# Patient Record
Sex: Female | Born: 1995 | Race: White | Hispanic: No | Marital: Single | Smoking: Never smoker
Health system: Southern US, Community
[De-identification: ages and names within clinical notes are randomized; demographics above are authoritative.]

## PROBLEM LIST (undated history)

## (undated) DIAGNOSIS — S83249A Other tear of medial meniscus, current injury, unspecified knee, initial encounter: Secondary | ICD-10-CM

## (undated) DIAGNOSIS — S83519A Sprain of anterior cruciate ligament of unspecified knee, initial encounter: Secondary | ICD-10-CM

## (undated) HISTORY — PX: ANTERIOR CRUCIATE LIGAMENT REPAIR: SHX115

---
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
## 2016-02-26 ENCOUNTER — Ambulatory Visit (INDEPENDENT_AMBULATORY_CARE_PROVIDER_SITE_OTHER): Payer: Self-pay | Admitting: Orthopedic Surgery

## 2016-02-26 DIAGNOSIS — M25561 Pain in right knee: Secondary | ICD-10-CM

## 2016-02-27 ENCOUNTER — Other Ambulatory Visit (INDEPENDENT_AMBULATORY_CARE_PROVIDER_SITE_OTHER): Payer: Self-pay | Admitting: Orthopedic Surgery

## 2016-02-27 DIAGNOSIS — M25562 Pain in left knee: Secondary | ICD-10-CM

## 2016-02-27 NOTE — Progress Notes (Addendum)
   Post-Op Visit Note   Patient: Annette Black           Date of Birth: 1995-08-21           MRN: [id number] Visit Date: 02/26/2016 PCP: Carollee HerterLALONDE,JOHN CHARLES, MD I transposed the note on Annette Black and Annette Black which is the reason for this atypical note.  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  She had left knee anterior cruciate ligament reconstruction bone patella tendon bone autograft in [place].  She had an injury earlier this year which was more of a strain.  MRI scan was done at that time which showed strain of the anterior cruciate ligament but generally intact structural fibers.  2 days ago she was doing box jumps at home as part of a training regimen and she felt and heard a pop in her left knee.  She's had difficulty weightbearing since that time.  On exam she lacks about 5 of full extension she has flexion to about 90 but is having some pain with that.  She distally has more anterior laxity with positive Lachman's on the left than she did several months ago.  Collaterals are stable PCL is intact there is no posterior lateral rotatory instability pedal pulses intact on the left plan at this time is for MRI scan to evaluate for anterior cruciate ligament tear.  She has a large effusion.  I like to have that evaluated in clinic later this week.  If it's and inhibiting her flexion and we will proceed with aspiration MRI scan is pending  Follow-Up Instructions: Return if symptoms worsen or fail to improve.   Orders:  No orders of the defined types were placed in this encounter.  No orders of the defined types were placed in this encounter.    PMFS History: There are no active problems to display for this patient.  No past medical  history on file.  No family history on file.  No past surgical history on file. Social History   Occupational History  . Not on file.   Social History Main Topics  . Smoking status: Not on file  . Smokeless tobacco: Not on file  . Alcohol use Not on file  . Drug use: Unknown  . Sexual activity: Not on file

## 2016-02-28 ENCOUNTER — Encounter (INDEPENDENT_AMBULATORY_CARE_PROVIDER_SITE_OTHER): Payer: Self-pay | Admitting: Orthopedic Surgery

## 2016-02-28 ENCOUNTER — Ambulatory Visit (INDEPENDENT_AMBULATORY_CARE_PROVIDER_SITE_OTHER): Payer: Managed Care, Other (non HMO) | Admitting: Orthopedic Surgery

## 2016-02-28 DIAGNOSIS — M25562 Pain in left knee: Secondary | ICD-10-CM

## 2016-02-28 DIAGNOSIS — S83512A Sprain of anterior cruciate ligament of left knee, initial encounter: Secondary | ICD-10-CM

## 2016-02-28 MED ORDER — LIDOCAINE HCL 1 % IJ SOLN
5.0000 mL | INTRAMUSCULAR | Status: AC | PRN
  Administered 2016-02-28: 5 mL

## 2016-02-28 NOTE — Progress Notes (Signed)
Office Visit Note   Patient: Annette Black           Date of Birth: 1995/11/15           MRN: [id number] Visit Date: 02/28/2016 Requested by: Ronnald NianJohn C Lalonde, MD [address] [place], [place] [id number] PCP: Carollee HerterLALONDE,JOHN CHARLES, MD  Subjective: Chief Complaint  Patient presents with  . 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  She is doing some training last week at home and did a box jump and had a pop in her knee.  She's had previous anterior cruciate ligament reconstruction on the left-hand side in 2015 bone patella tendon bone autograft.  Presence or absence of meniscal damage at that time unknown.  She initially was having difficulty weightbearing and she has been on crutches.              Review of Systems All systems reviewed are negative as they relate to the chief complaint within the history of present illness.  Patient denies  fevers or chills.    Assessment & Plan: Visit Diagnoses:  1. Acute pain of left knee   2. Complete tear of anterior cruciate ligament of left knee, initial encounter     Plan: Impression is left knee loadbearing injury with effusion and laxity on exam.  40 mL of serosanguineous fluid aspirated from the left knee today.  This is done for pain control and to help with of rehabilitation.  MRI scan pending.  High likelihood that she has complete graft rupture based on her exam and presence of bloody effusion in the knee.  Follow-Up Instructions: Return for after MRI.   Orders:  No orders of the defined types were placed in this encounter.  No orders of the defined types were placed in this encounter.     Procedures: Large Joint Inj Date/Time: 02/28/2016 10:02 AM Performed by: Cammy CopaEAN, SCOTT GREGORY Authorized by: Cammy CopaEAN, SCOTT GREGORY   Consent Given by:  Patient Site marked: the procedure site was marked   Timeout: prior to procedure the correct patient, procedure, and site was verified     Indications:  Pain, joint swelling and diagnostic evaluation Location:  Knee Site:  L knee Prep: patient was prepped and draped in usual sterile fashion   Needle Size:  18 G Needle Length:  1.5 inches Approach:  Superolateral Ultrasound Guidance: No   Fluoroscopic Guidance: No   Arthrogram: No   Medications:  5 mL lidocaine 1 % Aspiration Attempted: Yes   Aspirate amount (mL):  40 Aspirate:  Blood-tinged Patient tolerance:  Patient tolerated the procedure well with no immediate complications     Clinical Data: No additional findings.  Objective: Vital Signs: There were no vitals taken for this visit.  Physical Exam   Constitutional: Patient appears well-developed HEENT:  Head: Normocephalic Eyes:EOM are normal Neck: Normal range of motion Cardiovascular: Normal rate Pulmonary/chest: Effort normal Neurologic: Patient is alert Skin: Skin is warm Psychiatric: Patient has normal mood and affect    Ortho Exam examination of the left knee demonstrates effusion anterior cruciate ligament laxity with posterior lateral rotatory instability collateral patient stable pedal pulses intact no groin pain interlocks rotation leg no other masses lymph adenopathy or skin changes noted in the left knee region since her mechanism is intact  Specialty Comments:  No specialty comments available.  Imaging: No results found.   PMFS History: There are no active problems to display for this patient.  No past medical history on file.  No family history on file.  No past surgical history on file. Social History   Occupational History  . Not on file.   Social History Main Topics  . Smoking status: Never Smoker  . Smokeless tobacco: Never Used  . Alcohol use Not on file  . Drug use: Unknown  . Sexual activity: Not on file

## 2016-03-01 ENCOUNTER — Ambulatory Visit
Admission: RE | Admit: 2016-03-01 | Discharge: 2016-03-01 | Disposition: A | Discharge status: Home / Self Care | Payer: Managed Care, Other (non HMO) | Source: Ambulatory Visit | Attending: Orthopedic Surgery | Admitting: Orthopedic Surgery

## 2016-03-01 DIAGNOSIS — M25562 Pain in left knee: Secondary | ICD-10-CM

## 2016-03-04 ENCOUNTER — Other Ambulatory Visit (INDEPENDENT_AMBULATORY_CARE_PROVIDER_SITE_OTHER): Payer: Self-pay | Admitting: Orthopedic Surgery

## 2016-03-04 ENCOUNTER — Encounter (HOSPITAL_COMMUNITY): Payer: Self-pay | Admitting: *Deleted

## 2016-03-04 DIAGNOSIS — S83242A Other tear of medial meniscus, current injury, left knee, initial encounter: Secondary | ICD-10-CM

## 2016-03-04 NOTE — Progress Notes (Signed)
Pt denies SOB, chest pain, and being under the care of a cardiologist. Pt denies having a stress test, echo and cardiac cath. Pt denies having a chest x ray and EKG within the last year. Pt denies having recent labs. Pt made aware to stop taking taking Aspirin, vitamins, fish oil and herbal medications. Do not take any NSAIDs ie: Ibuprofen, Advil, Naproxen, BC and Goody Powder or any medication containing Aspirin. Pt verbalized understanding of all pre-op instructions.

## 2016-03-05 ENCOUNTER — Ambulatory Visit (HOSPITAL_COMMUNITY): Payer: Managed Care, Other (non HMO) | Admitting: Anesthesiology

## 2016-03-05 ENCOUNTER — Encounter (HOSPITAL_COMMUNITY): Payer: Self-pay | Admitting: Certified Registered Nurse Anesthetist

## 2016-03-05 ENCOUNTER — Encounter (HOSPITAL_COMMUNITY)
Admission: RE | Disposition: A | Discharge status: Home / Self Care | Payer: Self-pay | Source: Ambulatory Visit | Attending: Orthopedic Surgery

## 2016-03-05 ENCOUNTER — Ambulatory Visit (HOSPITAL_COMMUNITY)
Admission: RE | Admit: 2016-03-05 | Discharge: 2016-03-05 | Disposition: A | Discharge status: Home / Self Care | Payer: Managed Care, Other (non HMO) | Source: Ambulatory Visit | Attending: Orthopedic Surgery | Admitting: Orthopedic Surgery

## 2016-03-05 DIAGNOSIS — S83242A Other tear of medial meniscus, current injury, left knee, initial encounter: Secondary | ICD-10-CM

## 2016-03-05 DIAGNOSIS — Y92009 Unspecified place in unspecified non-institutional (private) residence as the place of occurrence of the external cause: Secondary | ICD-10-CM | POA: Insufficient documentation

## 2016-03-05 DIAGNOSIS — Z91018 Allergy to other foods: Secondary | ICD-10-CM | POA: Diagnosis not present

## 2016-03-05 DIAGNOSIS — X58XXXA Exposure to other specified factors, initial encounter: Secondary | ICD-10-CM | POA: Diagnosis not present

## 2016-03-05 DIAGNOSIS — S83212A Bucket-handle tear of medial meniscus, current injury, left knee, initial encounter: Secondary | ICD-10-CM | POA: Insufficient documentation

## 2016-03-05 DIAGNOSIS — Y998 Other external cause status: Secondary | ICD-10-CM | POA: Insufficient documentation

## 2016-03-05 DIAGNOSIS — Y93B9 Activity, other involving muscle strengthening exercises: Secondary | ICD-10-CM | POA: Insufficient documentation

## 2016-03-05 HISTORY — PX: KNEE ARTHROSCOPY WITH MENISCAL REPAIR: SHX5653

## 2016-03-05 HISTORY — DX: Other tear of medial meniscus, current injury, unspecified knee, initial encounter: S83.249A

## 2016-03-05 HISTORY — DX: Sprain of anterior cruciate ligament of unspecified knee, initial encounter: S83.519A

## 2016-03-05 LAB — CBC
HEMATOCRIT: 39.2 % (ref 36.0–46.0)
Hemoglobin: 13.1 g/dL (ref 12.0–15.0)
MCH: 30.2 pg (ref 26.0–34.0)
MCHC: 33.4 g/dL (ref 30.0–36.0)
MCV: 90.3 fL (ref 78.0–100.0)
PLATELETS: 180 10*3/uL (ref 150–400)
RBC: 4.34 MIL/uL (ref 3.87–5.11)
RDW: 12.7 % (ref 11.5–15.5)
WBC: 7.3 10*3/uL (ref 4.0–10.5)

## 2016-03-05 LAB — HCG, SERUM, QUALITATIVE: Preg, Serum: NEGATIVE

## 2016-03-05 SURGERY — ARTHROSCOPY, KNEE, WITH MENISCUS REPAIR
Anesthesia: General | Site: Knee | Laterality: Left

## 2016-03-05 MED ORDER — LIDOCAINE 2% (20 MG/ML) 5 ML SYRINGE
INTRAMUSCULAR | Status: AC
  Filled 2016-03-05: qty 10

## 2016-03-05 MED ORDER — BUPIVACAINE-EPINEPHRINE (PF) 0.5% -1:200000 IJ SOLN
INTRAMUSCULAR | Status: DC | PRN
  Administered 2016-03-05: 20 mL

## 2016-03-05 MED ORDER — BUPIVACAINE-EPINEPHRINE (PF) 0.5% -1:200000 IJ SOLN
INTRAMUSCULAR | Status: DC | PRN
  Administered 2016-03-05: 20 mL via PERINEURAL

## 2016-03-05 MED ORDER — SUGAMMADEX SODIUM 200 MG/2ML IV SOLN
INTRAVENOUS | Status: AC
  Filled 2016-03-05: qty 2

## 2016-03-05 MED ORDER — PROPOFOL 10 MG/ML IV BOLUS
INTRAVENOUS | Status: AC
  Filled 2016-03-05: qty 20

## 2016-03-05 MED ORDER — FENTANYL CITRATE (PF) 100 MCG/2ML IJ SOLN
INTRAMUSCULAR | Status: AC
  Filled 2016-03-05: qty 2

## 2016-03-05 MED ORDER — MIDAZOLAM HCL 2 MG/2ML IJ SOLN
INTRAMUSCULAR | Status: AC
  Filled 2016-03-05: qty 2

## 2016-03-05 MED ORDER — SUCCINYLCHOLINE CHLORIDE 200 MG/10ML IV SOSY
PREFILLED_SYRINGE | INTRAVENOUS | Status: AC
  Filled 2016-03-05: qty 10

## 2016-03-05 MED ORDER — LIDOCAINE 2% (20 MG/ML) 5 ML SYRINGE
INTRAMUSCULAR | Status: DC | PRN
  Administered 2016-03-05: 20 mg via INTRAVENOUS

## 2016-03-05 MED ORDER — CLONIDINE HCL (ANALGESIA) 100 MCG/ML EP SOLN
150.0000 ug | Freq: Once | EPIDURAL | Status: DC
  Filled 2016-03-05: qty 1.5

## 2016-03-05 MED ORDER — LACTATED RINGERS IV SOLN
INTRAVENOUS | Status: DC
  Administered 2016-03-05 (×2): via INTRAVENOUS

## 2016-03-05 MED ORDER — KETOROLAC TROMETHAMINE 30 MG/ML IJ SOLN: 30.0000 mg | Freq: Once | INTRAMUSCULAR | Status: DC | PRN

## 2016-03-05 MED ORDER — EPHEDRINE 5 MG/ML INJ
INTRAVENOUS | Status: AC
  Filled 2016-03-05: qty 20

## 2016-03-05 MED ORDER — CLONIDINE HCL (ANALGESIA) 100 MCG/ML EP SOLN
EPIDURAL | Status: DC | PRN
  Administered 2016-03-05: .75 mL

## 2016-03-05 MED ORDER — MIDAZOLAM HCL 5 MG/5ML IJ SOLN
INTRAMUSCULAR | Status: DC | PRN
  Administered 2016-03-05: 2 mg via INTRAVENOUS

## 2016-03-05 MED ORDER — CEFAZOLIN SODIUM-DEXTROSE 2-4 GM/100ML-% IV SOLN
2.0000 g | INTRAVENOUS | Status: AC
  Administered 2016-03-05: 2 g via INTRAVENOUS

## 2016-03-05 MED ORDER — ASPIRIN EC 325 MG PO TBEC: 325.0000 mg | DELAYED_RELEASE_TABLET | Freq: Every day | ORAL | 0 refills | Status: AC

## 2016-03-05 MED ORDER — PROMETHAZINE HCL 25 MG/ML IJ SOLN: 6.2500 mg | INTRAMUSCULAR | Status: DC | PRN

## 2016-03-05 MED ORDER — ONDANSETRON HCL 4 MG/2ML IJ SOLN
INTRAMUSCULAR | Status: DC | PRN
  Administered 2016-03-05: 4 mg via INTRAVENOUS

## 2016-03-05 MED ORDER — DEXAMETHASONE SODIUM PHOSPHATE 10 MG/ML IJ SOLN
INTRAMUSCULAR | Status: AC
  Filled 2016-03-05: qty 1

## 2016-03-05 MED ORDER — PROPOFOL 10 MG/ML IV BOLUS
INTRAVENOUS | Status: DC | PRN
  Administered 2016-03-05: 50 mg via INTRAVENOUS
  Administered 2016-03-05: 150 mg via INTRAVENOUS

## 2016-03-05 MED ORDER — DEXAMETHASONE SODIUM PHOSPHATE 10 MG/ML IJ SOLN
INTRAMUSCULAR | Status: DC | PRN
  Administered 2016-03-05: 5 mg via INTRAVENOUS

## 2016-03-05 MED ORDER — SODIUM CHLORIDE 0.9 % IR SOLN
Status: DC | PRN
  Administered 2016-03-05 (×2): 3000 mL

## 2016-03-05 MED ORDER — MORPHINE SULFATE (PF) 4 MG/ML IV SOLN
INTRAVENOUS | Status: AC
  Filled 2016-03-05: qty 2

## 2016-03-05 MED ORDER — HYDROMORPHONE HCL 1 MG/ML IJ SOLN: 0.2500 mg | INTRAMUSCULAR | Status: DC | PRN

## 2016-03-05 MED ORDER — ONDANSETRON HCL 4 MG/2ML IJ SOLN
INTRAMUSCULAR | Status: AC
  Filled 2016-03-05: qty 2

## 2016-03-05 MED ORDER — STERILE WATER FOR IRRIGATION IR SOLN
Status: DC | PRN
  Administered 2016-03-05: 1000 mL

## 2016-03-05 MED ORDER — PHENYLEPHRINE 40 MCG/ML (10ML) SYRINGE FOR IV PUSH (FOR BLOOD PRESSURE SUPPORT)
PREFILLED_SYRINGE | INTRAVENOUS | Status: AC
  Filled 2016-03-05: qty 10

## 2016-03-05 MED ORDER — MORPHINE SULFATE (PF) 4 MG/ML IV SOLN
INTRAVENOUS | Status: DC | PRN
  Administered 2016-03-05 (×2): 4 mg

## 2016-03-05 MED ORDER — CHLORHEXIDINE GLUCONATE 4 % EX LIQD: 60.0000 mL | Freq: Once | CUTANEOUS | Status: DC

## 2016-03-05 MED ORDER — ROCURONIUM BROMIDE 50 MG/5ML IV SOSY
PREFILLED_SYRINGE | INTRAVENOUS | Status: AC
  Filled 2016-03-05: qty 10

## 2016-03-05 MED ORDER — OXYCODONE HCL 5 MG PO TABS: 5.0000 mg | ORAL_TABLET | ORAL | 0 refills | Status: AC | PRN

## 2016-03-05 MED ORDER — FENTANYL CITRATE (PF) 100 MCG/2ML IJ SOLN
INTRAMUSCULAR | Status: AC
  Filled 2016-03-05: qty 4

## 2016-03-05 MED ORDER — EPINEPHRINE PF 1 MG/ML IJ SOLN
INTRAMUSCULAR | Status: DC | PRN
  Administered 2016-03-05 (×4)

## 2016-03-05 MED ORDER — FENTANYL CITRATE (PF) 100 MCG/2ML IJ SOLN
INTRAMUSCULAR | Status: DC | PRN
  Administered 2016-03-05: 25 ug via INTRAVENOUS
  Administered 2016-03-05 (×3): 50 ug via INTRAVENOUS
  Administered 2016-03-05: 25 ug via INTRAVENOUS

## 2016-03-05 MED ORDER — 0.9 % SODIUM CHLORIDE (POUR BTL) OPTIME
TOPICAL | Status: DC | PRN
  Administered 2016-03-05: 1000 mL

## 2016-03-05 MED ORDER — BUPIVACAINE-EPINEPHRINE (PF) 0.5% -1:200000 IJ SOLN
INTRAMUSCULAR | Status: AC
  Filled 2016-03-05: qty 30

## 2016-03-05 MED ORDER — EPINEPHRINE PF 1 MG/ML IJ SOLN
INTRAMUSCULAR | Status: AC
  Filled 2016-03-05: qty 4

## 2016-03-05 SURGICAL SUPPLY — 80 items
ALCOHOL 70% 16 OZ (MISCELLANEOUS) ×3 IMPLANT
BANDAGE ACE 6X5 VEL STRL LF (GAUZE/BANDAGES/DRESSINGS) ×3 IMPLANT
BANDAGE ESMARK 6X9 LF (GAUZE/BANDAGES/DRESSINGS) ×1 IMPLANT
BLADE CUTTER GATOR 3.5 (BLADE) IMPLANT
BLADE GREAT WHITE 4.2 (BLADE) ×2 IMPLANT
BLADE GREAT WHITE 4.2MM (BLADE) ×1
BLADE SURG 10 STRL SS (BLADE) ×3 IMPLANT
BLADE SURG 15 STRL LF DISP TIS (BLADE) ×1 IMPLANT
BLADE SURG 15 STRL SS (BLADE) ×2
BLADE SURG ROTATE 9660 (MISCELLANEOUS) IMPLANT
BNDG ESMARK 6X9 LF (GAUZE/BANDAGES/DRESSINGS) ×3
BUR OVAL 6.0 (BURR) IMPLANT
CLOSURE STERI-STRIP 1/2X4 (GAUZE/BANDAGES/DRESSINGS) ×1
CLSR STERI-STRIP ANTIMIC 1/2X4 (GAUZE/BANDAGES/DRESSINGS) ×2 IMPLANT
COVER MAYO STAND STRL (DRAPES) ×3 IMPLANT
COVER SURGICAL LIGHT HANDLE (MISCELLANEOUS) ×3 IMPLANT
CUFF TOURNIQUET SINGLE 34IN LL (TOURNIQUET CUFF) ×3 IMPLANT
CUFF TOURNIQUET SINGLE 44IN (TOURNIQUET CUFF) IMPLANT
DRAPE ARTHROSCOPY W/POUCH 114 (DRAPES) ×3 IMPLANT
DRAPE INCISE IOBAN 66X45 STRL (DRAPES) ×6 IMPLANT
DRAPE ORTHO SPLIT 77X108 STRL (DRAPES) ×6
DRAPE PROXIMA HALF (DRAPES) IMPLANT
DRAPE SURG ORHT 6 SPLT 77X108 (DRAPES) ×3 IMPLANT
DRAPE U-SHAPE 47X51 STRL (DRAPES) ×3 IMPLANT
DRSG TEGADERM 4X4.75 (GAUZE/BANDAGES/DRESSINGS) ×3 IMPLANT
DURAPREP 26ML APPLICATOR (WOUND CARE) ×6 IMPLANT
ELECT REM PT RETURN 9FT ADLT (ELECTROSURGICAL) ×3
ELECTRODE REM PT RTRN 9FT ADLT (ELECTROSURGICAL) ×1 IMPLANT
FIBERLOOP 2 0 (SUTURE) ×33 IMPLANT
FILTER STRAW FLUID ASPIR (MISCELLANEOUS) ×6 IMPLANT
FLUID NSS /IRRIG 3000 ML XXX (IV SOLUTION) ×12 IMPLANT
GAUZE SPONGE 4X4 12PLY STRL (GAUZE/BANDAGES/DRESSINGS) ×3 IMPLANT
GAUZE XEROFORM 1X8 LF (GAUZE/BANDAGES/DRESSINGS) ×3 IMPLANT
GLOVE BIOGEL PI IND STRL 7.5 (GLOVE) ×1 IMPLANT
GLOVE BIOGEL PI IND STRL 8 (GLOVE) ×1 IMPLANT
GLOVE BIOGEL PI INDICATOR 7.5 (GLOVE) ×2
GLOVE BIOGEL PI INDICATOR 8 (GLOVE) ×2
GLOVE ECLIPSE 7.0 STRL STRAW (GLOVE) ×3 IMPLANT
GLOVE SURG ORTHO 8.0 STRL STRW (GLOVE) ×3 IMPLANT
GOWN STRL REUS W/ TWL LRG LVL3 (GOWN DISPOSABLE) ×2 IMPLANT
GOWN STRL REUS W/ TWL XL LVL3 (GOWN DISPOSABLE) ×1 IMPLANT
GOWN STRL REUS W/TWL LRG LVL3 (GOWN DISPOSABLE) ×4
GOWN STRL REUS W/TWL XL LVL3 (GOWN DISPOSABLE) ×2
IMMOBILIZER KNEE 20 (SOFTGOODS) ×3
IMMOBILIZER KNEE 20 THIGH 36 (SOFTGOODS) ×1 IMPLANT
KIT BASIN OR (CUSTOM PROCEDURE TRAY) ×3 IMPLANT
KIT ROOM TURNOVER OR (KITS) ×3 IMPLANT
MANIFOLD NEPTUNE II (INSTRUMENTS) IMPLANT
NEEDLE 18GX1X1/2 (RX/OR ONLY) (NEEDLE) ×6 IMPLANT
NEEDLE HYPO 25GX1X1/2 BEV (NEEDLE) ×3 IMPLANT
NEEDLE SUT 2-0 SCORPION KNEE (NEEDLE) ×3 IMPLANT
NS IRRIG 1000ML POUR BTL (IV SOLUTION) ×3 IMPLANT
PACK ARTHROSCOPY DSU (CUSTOM PROCEDURE TRAY) ×3 IMPLANT
PAD ARMBOARD 7.5X6 YLW CONV (MISCELLANEOUS) ×6 IMPLANT
PADDING CAST COTTON 6X4 STRL (CAST SUPPLIES) ×3 IMPLANT
PENCIL BUTTON HOLSTER BLD 10FT (ELECTRODE) ×3 IMPLANT
SET ARTHROSCOPY TUBING (MISCELLANEOUS) ×4
SET ARTHROSCOPY TUBING LN (MISCELLANEOUS) ×2 IMPLANT
SOL PREP POV-IOD 4OZ 10% (MISCELLANEOUS) ×3 IMPLANT
SPONGE GAUZE 4X4 12PLY STER LF (GAUZE/BANDAGES/DRESSINGS) ×3 IMPLANT
SPONGE LAP 4X18 X RAY DECT (DISPOSABLE) ×3 IMPLANT
SUT ETHILON 3 0 PS 1 (SUTURE) ×3 IMPLANT
SUT FIBERWIRE 2-0 18 17.9 3/8 (SUTURE)
SUT MENISCAL KIT (KITS) ×6 IMPLANT
SUT MNCRL AB 3-0 PS2 18 (SUTURE) ×3 IMPLANT
SUT VIC AB 2-0 CT1 27 (SUTURE) ×2
SUT VIC AB 2-0 CT1 TAPERPNT 27 (SUTURE) ×1 IMPLANT
SUT VIC AB 3-0 CT1 27 (SUTURE) ×2
SUT VIC AB 3-0 CT1 TAPERPNT 27 (SUTURE) ×1 IMPLANT
SUTURE FIBERWR 2-0 18 17.9 3/8 (SUTURE) IMPLANT
SYR 20ML ECCENTRIC (SYRINGE) ×3 IMPLANT
SYR CONTROL 10ML LL (SYRINGE) IMPLANT
SYR TB 1ML LUER SLIP (SYRINGE) ×9 IMPLANT
TOWEL OR 17X24 6PK STRL BLUE (TOWEL DISPOSABLE) ×3 IMPLANT
TOWEL OR 17X26 10 PK STRL BLUE (TOWEL DISPOSABLE) ×3 IMPLANT
TUBE CONNECTING 12'X1/4 (SUCTIONS) ×1
TUBE CONNECTING 12X1/4 (SUCTIONS) ×2 IMPLANT
WAND HAND CNTRL MULTIVAC 90 (MISCELLANEOUS) IMPLANT
WAND STAR VAC 90 (SURGICAL WAND) ×3 IMPLANT
WATER STERILE IRR 1000ML POUR (IV SOLUTION) ×3 IMPLANT

## 2016-03-05 NOTE — Anesthesia Postprocedure Evaluation (Signed)
Anesthesia Post Note  Patient: Armed forces technical officerCienna Black  Procedure(s) Performed: Procedure(s) (LRB): LEFT KNEE ARTHROSCOPY WITH MENISCAL REPAIR (Left)  Patient location during evaluation: PACU Anesthesia Type: General and Regional Level of consciousness: awake and alert Pain management: pain level controlled Vital Signs Assessment: post-procedure vital signs reviewed and stable Respiratory status: spontaneous breathing, nonlabored ventilation and respiratory function stable Cardiovascular status: blood pressure returned to baseline and stable Postop Assessment: no signs of nausea or vomiting Anesthetic complications: no       Last Vitals:  Vitals:   03/05/16 1530 03/05/16 1542  BP: 124/72   Pulse: 91 81  Resp: 16 16  Temp:  36.1 C    Last Pain:  Vitals:   03/05/16 0941  TempSrc:   PainSc: 2             L Sensory Level: S1-Sole of foot, small toes (03/05/16 1530) R Sensory Level: S1-Sole of foot, small toes (03/05/16 1530)  Tymber Stallings,W. EDMOND

## 2016-03-05 NOTE — H&P (Signed)
Aslin Farinas is an 21 y.o. female.   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  She injured her left knee several weeks ago during winter break when she was training at home.  She had previous anterior cruciate ligament reconstruction in 2015 in [place].  This is bone patella tendon bone autograft.  Previous MRI scan demonstrated some strain of the ligament re-months ago after an injury but it was intact.  Since that time she has had another MRI scan which shows bucket-handle tear of the medial meniscus.  There is no family history of DVT or pulmonary embolism.  Past Medical History:  Diagnosis Date  . ACL (anterior cruciate ligament) tear   . Acute medial meniscal tear    left    Past Surgical History:  Procedure Laterality Date  . ANTERIOR CRUCIATE LIGAMENT REPAIR     left    History reviewed. No pertinent family history. Social History:  reports that she has never smoked. She has never used smokeless tobacco. She reports that she does not drink alcohol or use drugs.  Allergies:  Allergies  Allergen Reactions  . Food Other (See Comments)    Bananas-mouth irritation cucumbers, honey dew melon, cantaloupe, & watermelon- sore throat    Medications Prior to Admission  Medication Sig Dispense Refill  . ibuprofen (ADVIL,MOTRIN) 200 MG tablet Take 200-400 mg by mouth every 8 (eight) hours as needed (for pain.).      Results for orders placed or performed during the hospital encounter of 03/05/16 (from the past 48 hour(s))  CBC     Status: None   Collection Time: 03/05/16  9:07 AM  Result Value Ref Range   WBC 7.3 4.0 - 10.5 K/uL   RBC 4.34 3.87 - 5.11 MIL/uL   Hemoglobin 13.1 12.0 - 15.0 g/dL   HCT 40.9 81.1 - 91.4 %   MCV 90.3 78.0 - 100.0 fL   MCH 30.2 26.0 - 34.0 pg   MCHC 33.4 30.0 - 36.0 g/dL   RDW 78.2 95.6 - 21.3 %   Platelets 180 150 - 400 K/uL   No results found.  Review of Systems  Musculoskeletal:  Positive for joint pain.  All other systems reviewed and are negative.   Blood pressure 108/66, pulse 67, temperature 98 F (36.7 C), temperature source Oral, resp. rate 18, last menstrual period 03/04/2016, SpO2 100 %. Physical Exam  Constitutional: She appears well-developed.  HENT:  Head: Normocephalic.  Eyes: Pupils are equal, round, and reactive to light.  Neck: Normal range of motion.  Cardiovascular: Normal rate.   Respiratory: Effort normal.  Neurological: She is alert.  Skin: Skin is warm.  Psychiatric: She has a normal mood and affect.   Examination the left knee demonstrates lack of full extension by 5 effusion is present extensor mechanism is intact pedal pulses palpable well-healed surgical incision from the patellar tendon harvest site is intact.  Patient has no posterior lateral rotatory instability.  Has about 3 mm anterior drawer on the left compared to about 1-2 on the right.  Does feel like there is an endpoint.  But it is a little bit of a difficult exam due to some pain she's having from her meniscal tear.  Assessment/Plan Impression is bucket-handle tear medial meniscus in the left knee.  On MRI scan there was no real bone bruising pattern indicating recurrent injury to the anterior cruciate ligament.  On exam the anterior cruciate ligament may be  a little bit stretched but functionally it does appear to have an endpoint.  It does look intact on the MRI scan.  The repairability of the meniscal tear is to be assessed at the time of arthroscopy.  I'll like to repair this if possible.  Risks and benefits of repair discussed including not limited to infection or vessel damage failure of the repair.  Patient understands the risks and benefits of surgical intervention also discussed the planned surgery with her mother.  All questions answered.  Burnard BuntingG Scott Dorean Daniello, MD 03/05/2016, 10:03 AM

## 2016-03-05 NOTE — Anesthesia Procedure Notes (Signed)
Procedure Name: LMA Insertion Date/Time: 03/05/2016 11:46 AM Performed by: Faustino CongressWHITE, Alexanderia Gorby TENA Donya Tomaro Pre-anesthesia Checklist: Patient identified, Emergency Drugs available, Suction available and Patient being monitored Patient Re-evaluated:Patient Re-evaluated prior to inductionOxygen Delivery Method: Circle System Utilized Preoxygenation: Pre-oxygenation with 100% oxygen Intubation Type: IV induction LMA: LMA inserted LMA Size: 4.0 Number of attempts: 1 Placement Confirmation: positive ETCO2 Tube secured with: Tape Dental Injury: Teeth and Oropharynx as per pre-operative assessment

## 2016-03-05 NOTE — Transfer of Care (Signed)
Immediate Anesthesia Transfer of Care Note  Patient: Annette Black  Procedure(s) Performed: Procedure(s): LEFT KNEE ARTHROSCOPY WITH MENISCAL REPAIR (Left)  Patient Location: PACU  Anesthesia Type:GA combined with regional for post-op pain  Level of Consciousness: lethargic and responds to stimulation  Airway & Oxygen Therapy: Patient Spontanous Breathing  Post-op Assessment: Report given to RN and Post -op Vital signs reviewed and stable  Post vital signs: Reviewed and stable  Last Vitals:  Vitals:   03/05/16 0938  BP: 108/66  Pulse: 67  Resp: 18  Temp: 36.7 C    Last Pain:  Vitals:   03/05/16 0941  TempSrc:   PainSc: 2       Patients Stated Pain Goal: 2 (03/05/16 0941)  Complications: No apparent anesthesia complications

## 2016-03-05 NOTE — Brief Op Note (Signed)
03/05/2016  3:13 PM  PATIENT:  Annette Black  21 y.o. female  PRE-OPERATIVE DIAGNOSIS:  left knee medial meniscal tear  POST-OPERATIVE DIAGNOSIS:  left knee medial meniscal tear  PROCEDURE:  Procedure(s): LEFT KNEE ARTHROSCOPY WITH MENISCAL REPAIR  SURGEON:  Surgeon(s): Cammy CopaScott Jasha Hodzic, MD  ASSISTANT: Patrick Jupiterarla Bethune rnfa  ANESTHESIA:   regional and general  EBL: 15 ml    Total I/O In: 1300 [I.V.:1300] Out: 50 [Blood:50]  BLOOD ADMINISTERED: none  DRAINS: none   LOCAL MEDICATIONS USED:  Marcaine ms04 clonodine  SPECIMEN:  No Specimen  COUNTS:  YES  TOURNIQUET:    DICTATION: .Other Dictation: Dictation Number [id number]  PLAN OF CARE: Discharge to home after PACU  PATIENT DISPOSITION:  PACU - hemodynamically stable

## 2016-03-05 NOTE — Anesthesia Procedure Notes (Signed)
Anesthesia Regional Block:  Adductor canal block  Pre-Anesthetic Checklist: ,, timeout performed, Correct Patient, Correct Site, Correct Laterality, Correct Procedure, Correct Position, site marked, Risks and benefits discussed,  Surgical consent,  Pre-op evaluation,  At surgeon's request and post-op pain management  Laterality: Left  Prep: chloraprep       Needles:  Injection technique: Single-shot  Needle Type: Echogenic Needle     Needle Length: 9cm 9 cm Needle Gauge: 21 and 21 G    Additional Needles:  Procedures: ultrasound guided (picture in chart) Adductor canal block Narrative:  Start time: 03/05/2016 11:25 AM End time: 03/05/2016 11:33 AM Injection made incrementally with aspirations every 5 mL.  Performed by: Personally  Anesthesiologist: Marcene DuosFITZGERALD, Maeven Mcdougall

## 2016-03-05 NOTE — Anesthesia Preprocedure Evaluation (Addendum)
Anesthesia Evaluation  Patient identified by MRN, date of birth, ID band Patient awake    Reviewed: Allergy & Precautions, NPO status , Patient's Chart, lab work & pertinent test results  Airway Mallampati: II  TM Distance: >3 FB Neck ROM: Full    Dental  (+) Dental Advisory Given   Pulmonary neg pulmonary ROS,    breath sounds clear to auscultation       Cardiovascular negative cardio ROS   Rhythm:Regular Rate:Normal     Neuro/Psych negative neurological ROS     GI/Hepatic negative GI ROS, Neg liver ROS,   Endo/Other  negative endocrine ROS  Renal/GU negative Renal ROS     Musculoskeletal negative musculoskeletal ROS (+)   Abdominal   Peds  Hematology negative hematology ROS (+)   Anesthesia Other Findings   Reproductive/Obstetrics                            Anesthesia Physical Anesthesia Plan  ASA: I  Anesthesia Plan: General   Post-op Pain Management:    Induction: Intravenous  Airway Management Planned: LMA  Additional Equipment:   Intra-op Plan:   Post-operative Plan: Extubation in OR  Informed Consent: I have reviewed the patients History and Physical, chart, labs and discussed the procedure including the risks, benefits and alternatives for the proposed anesthesia with the patient or authorized representative who has indicated his/her understanding and acceptance.   Dental advisory given  Plan Discussed with:   Anesthesia Plan Comments:         Anesthesia Quick Evaluation

## 2016-03-06 ENCOUNTER — Encounter (HOSPITAL_COMMUNITY): Payer: Self-pay | Admitting: Orthopedic Surgery

## 2016-03-06 NOTE — Op Note (Signed)
NAMMargette Fast:  Black, Annette              ACCOUNT NO.:  [id number]  MEDICAL RECORD NO.:  [id number]  LOCATION:                                 FACILITY:  PHYSICIAN:  Burnard BuntingG. Scott Dean, M.D.    DATE OF BIRTH:  31-May-1995  DATE OF PROCEDURE: DATE OF DISCHARGE:                              OPERATIVE REPORT   PREOPERATIVE DIAGNOSIS:  Left knee bucket-handle tear of medial meniscus involving the entire meniscus.  POSTOPERATIVE DIAGNOSIS:  Left knee bucket-handle tear of medial meniscus involving the entire meniscus.  PROCEDURES:  Left knee arthroscopy, medial meniscal repair.  SURGEON:  Burnard BuntingG. Scott Dean, M.D.  ASSISTANT:  Patrick Jupiterarla Bethune, RNFA.  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  She has a bucket-handle tear of the medial meniscus involving the entire meniscus, flipped anterior into the anterior aspect of the knee joint. She presents now for operative management after explanation of risks and benefits.  OPERATIVE FINDINGS:  Examination under anesthesia, range of motion.  The patient lacked about 7 degrees of full extension, but had good flexion. ACL has about 2 mm more anterior laxity on the left compared to the right, consistent with [place].  There was no posterolateral rotatory instability.  Collaterals were stable at 0 and 30 degrees.  PROCEDURE IN DETAIL:  The patient was brought to the operating room where general anesthetic was induced.  Preoperative antibiotics were administered.  Time-out was called.  Left leg was prescrubbed with alcohol and Betadine, allowed to air dry, prepped with DuraPrep solution and draped in a sterile manner.  [name] was used to cover the operative field.  Tourniquet not utilized.  Anterior inferolateral portal was established.  Anterior inferomedial portal was established under direct visualization.  Diagnostic arthroscopy was performed.  The patient had intact lateral  compartment, articular cartilage and meniscus intact, undersurface of the patella.  The ACL was examined and had some laxity, but definitely was intact both proximally and distally at its attachments.  I would say majority of the ACL graft was intact.  At this time, the medial compartment was inspected.  The entire medial meniscus was flipped into the anterior portion of the joint.  This was reducible. The parameniscal synovium and the capsule were then rasped with a shaver.  A decent bleeding bed of tissue was prepared.  At this time, following preparation of both the meniscal bed, the parameniscal synovium and the capsule, the meniscus was reduced.  Incision was made posteromedial aspect of the knee with care being taken to avoid injury to the saphenous vein and nerve.  Branches preserved when possible. Plane was developed between the medial head of the gastrocs and the capsule more anteriorly.  With the speculum retractor in place, sutures passed in alternating horizontal mattress and vertical mattress fashion in order to effect good repair.  Ten sutures of 2-0 FiberWire suture were utilized.  They were tied posterior to anterior with the knee in about 30 degrees of extension.  Good secure repair was achieved.  At this time, thorough irrigation of the knee joint and the incision were performed.  It should be noted that  a transpatellar tendon portal was required for the final few posterior structures.  The neurovascular structures were protected with the retractor during passage at this point.  The portals were closed using 3-0 Vicryl and 3-0 nylon. Posterior incision closed using 2-0 Vicryl and 3-0 Monocryl.  A solution of Marcaine, morphine and clonidine injected into the knee.  Impervious dressings and Ace wrap applied.  The patient tolerated the procedure well without immediate complications.  Knee immobilizer applied.  She will follow up with me in 7 days.     Burnard Bunting,  M.D.   ______________________________ [name], M.D.    GSD/MEDQ  D:  03/05/2016  T:  03/06/2016  Job:  [id number]

## 2016-03-11 ENCOUNTER — Ambulatory Visit (INDEPENDENT_AMBULATORY_CARE_PROVIDER_SITE_OTHER): Payer: Self-pay | Admitting: Orthopedic Surgery

## 2016-03-11 DIAGNOSIS — S83242D Other tear of medial meniscus, current injury, left knee, subsequent encounter: Secondary | ICD-10-CM

## 2016-03-11 NOTE — Progress Notes (Signed)
   Post-Op Visit Note   Patient: Annette Black           Date of Birth: 13-Oct-1995           MRN: [id number] Visit Date: 03/11/2016 PCP: Carollee HerterLALONDE,JOHN CHARLES, MD   Assessment & Plan:  Chief Complaint: No chief complaint on file.  Visit Diagnoses:  1. Acute medial meniscus tear of left knee, subsequent encounter     Plan: Patient is a 21 year old female with left knee medial meniscal repair.  She is 6 days out from surgery.  Taking aspirin for DVT prophylaxis.  She has been nonweightbearing.  On exam incisions intact portal sutures intact no effusion is present she can do a straight leg raise.  No paresthesias medial or lateral leg.  Foot is perfused and sensate.  Plan nonweightbearing for 3 more weeks okay to bend 45 DC sutures tomorrow no evidence of DVT follow-up in 3 weeks  Follow-Up Instructions: Return if symptoms worsen or fail to improve.   Orders:  No orders of the defined types were placed in this encounter.  No orders of the defined types were placed in this encounter.   Imaging: No results found.  PMFS History: There are no active problems to display for this patient.  Past Medical History:  Diagnosis Date  . ACL (anterior cruciate ligament) tear   . Acute medial meniscal tear    left    No family history on file.  Past Surgical History:  Procedure Laterality Date  . ANTERIOR CRUCIATE LIGAMENT REPAIR     left  . KNEE ARTHROSCOPY WITH MENISCAL REPAIR Left 03/05/2016   Procedure: LEFT KNEE ARTHROSCOPY WITH MENISCAL REPAIR;  Surgeon: Cammy CopaScott Dalina Samara, MD;  Location: [facility] OR;  Service: Orthopedics;  Laterality: Left;   Social History   Occupational History  . Not on file.   Social History Main Topics  . Smoking status: Never Smoker  . Smokeless tobacco: Never Used  . Alcohol use No  . Drug use: No  . Sexual activity: Not on file

## 2016-04-01 ENCOUNTER — Ambulatory Visit (INDEPENDENT_AMBULATORY_CARE_PROVIDER_SITE_OTHER): Payer: Self-pay | Admitting: Orthopedic Surgery

## 2016-04-01 DIAGNOSIS — S83242A Other tear of medial meniscus, current injury, left knee, initial encounter: Secondary | ICD-10-CM

## 2016-04-01 DIAGNOSIS — S83242D Other tear of medial meniscus, current injury, left knee, subsequent encounter: Secondary | ICD-10-CM

## 2016-04-02 NOTE — Progress Notes (Signed)
   Post-Op Visit Note   Patient: Annette Black           Date of Birth: 20-Feb-1995           MRN: [id number] Visit Date: 04/01/2016 PCP: Carollee HerterLALONDE,JOHN CHARLES, MD   Assessment & Plan:  Chief Complaint: No chief complaint on file.  Visit Diagnoses: No diagnosis found.  Plan: Patient is now about 4 weeks out left knee medial meniscal tear repair.  Patient's been doing well.  On exam she has flexion easily to 90 and full extension.  Graft is stable.  No effusion is noted in the knee.  Quad atrophy is present but that's expected.  Plan at this time is to advance her weightbearing gradually over the next 2 weeks.  Okay to start doing mini squats.  Still no loading with the knee flexed greater than 90.  I'll check him back in 4 weeks for clinical recheck.  Discussed with mildly her physical therapist the rehabilitation plan.  Her mother was also on the phone during the visit.  Follow-Up Instructions: No Follow-up on file.   Orders:  No orders of the defined types were placed in this encounter.  No orders of the defined types were placed in this encounter.   Imaging: No results found.  PMFS History: Patient Active Problem List   Diagnosis Date Noted  . Acute medial meniscus tear of left knee    Past Medical History:  Diagnosis Date  . ACL (anterior cruciate ligament) tear   . Acute medial meniscal tear    left    No family history on file.  Past Surgical History:  Procedure Laterality Date  . ANTERIOR CRUCIATE LIGAMENT REPAIR     left  . KNEE ARTHROSCOPY WITH MENISCAL REPAIR Left 03/05/2016   Procedure: LEFT KNEE ARTHROSCOPY WITH MENISCAL REPAIR;  Surgeon: Cammy CopaScott Gabriel Paulding, MD;  Location: [facility] OR;  Service: Orthopedics;  Laterality: Left;   Social History   Occupational History  . Not on file.   Social History Main Topics  . Smoking status: Never Smoker  . Smokeless tobacco: Never Used  . Alcohol use No  . Drug use: No  . Sexual activity: Not on file

## 2016-05-06 ENCOUNTER — Ambulatory Visit (INDEPENDENT_AMBULATORY_CARE_PROVIDER_SITE_OTHER): Payer: Self-pay | Admitting: Orthopedic Surgery

## 2016-05-06 DIAGNOSIS — M25562 Pain in left knee: Secondary | ICD-10-CM

## 2016-05-06 DIAGNOSIS — G8929 Other chronic pain: Secondary | ICD-10-CM

## 2016-05-06 NOTE — Progress Notes (Signed)
   Post-Op Visit Note   Patient: Annette Black           Date of Birth: [birth year]           MRN: [id number] Visit Date: 05/06/2016 PCP: Carollee HerterLALONDE,JOHN CHARLES, MD   Assessment & Plan:  Chief Complaint: No chief complaint on file.  Visit Diagnoses: No diagnosis found.  Plan: Patient is 21 year old female who is now 8 weeks out medial meniscal repair.  She's doing well.  No effusion excellent range of motion.  Lacking about 10 of full flexion which is not a functional issue at this time.  Plan is to progress her activity but I don't want her doing any cutting or pivoting.  I'll see her back in 6 weeks and then we will likely let her start doing some running at that time I'll she is under the auspices is of [facility] [place].  Follow-Up Instructions: No Follow-up on file.   Orders:  No orders of the defined types were placed in this encounter.  No orders of the defined types were placed in this encounter.   Imaging: No results found.  PMFS History: Patient Active Problem List   Diagnosis Date Noted  . Acute medial meniscus tear of left knee    Past Medical History:  Diagnosis Date  . ACL (anterior cruciate ligament) tear   . Acute medial meniscal tear    left    No family history on file.  Past Surgical History:  Procedure Laterality Date  . ANTERIOR CRUCIATE LIGAMENT REPAIR     left  . KNEE ARTHROSCOPY WITH MENISCAL REPAIR Left 03/05/2016   Procedure: LEFT KNEE ARTHROSCOPY WITH MENISCAL REPAIR;  Surgeon: Cammy CopaScott Ariaunna Longsworth, MD;  Location: [facility] OR;  Service: Orthopedics;  Laterality: Left;   Social History   Occupational History  . Not on file.   Social History Main Topics  . Smoking status: Never Smoker  . Smokeless tobacco: Never Used  . Alcohol use No  . Drug use: No  . Sexual activity: Not on file

## 2016-06-03 ENCOUNTER — Ambulatory Visit (INDEPENDENT_AMBULATORY_CARE_PROVIDER_SITE_OTHER): Payer: PRIVATE HEALTH INSURANCE | Admitting: Orthopedic Surgery

## 2016-06-03 ENCOUNTER — Encounter (INDEPENDENT_AMBULATORY_CARE_PROVIDER_SITE_OTHER): Payer: Self-pay | Admitting: Orthopedic Surgery

## 2016-06-03 DIAGNOSIS — S83242D Other tear of medial meniscus, current injury, left knee, subsequent encounter: Secondary | ICD-10-CM

## 2016-06-03 NOTE — Progress Notes (Signed)
   Post-Op Visit Note   Patient: Annette Black           Date of Birth: 11-Jun-1995           MRN: [id number] Visit Date: 06/03/2016 PCP: Carollee Herter, MD   Assessment & Plan:  Chief Complaint: No chief complaint on file.  Visit Diagnoses: No diagnosis found.  Plan: Patient is now 3 months out from medial meniscal repair.  She's got about 1-2 cm of quad atrophy left versus right.  Biodex testing shows no deficit in the quads or hamstrings.  She's been doing squats knee extension hamstring curls lunges.  This is a very large meniscal tear and a previously anterior cruciate ligament reconstructed knee.  On exam there is no effusion and very good range of motion no real joint line tenderness she has only mild tenderness anterior to the medial incision.  Plan at this time is to let her start doing some straight ahead running only.  I think in 2 months she could start doing cutting and pivoting.  That would be 5 months postop.  For now and have her do straight ahead running followed by backwards followed by lateral only.  If she does a cane with that at home then we can progress to cutting and pivoting in 2 months in mid June which would give her a month before returning for early preseason  Follow-Up Instructions: Return if symptoms worsen or fail to improve.   Orders:  No orders of the defined types were placed in this encounter.  No orders of the defined types were placed in this encounter.   Imaging: No results found.  PMFS History: Patient Active Problem List   Diagnosis Date Noted  . Acute medial meniscus tear of left knee    Past Medical History:  Diagnosis Date  . ACL (anterior cruciate ligament) tear   . Acute medial meniscal tear    left    No family history on file.  Past Surgical History:  Procedure Laterality Date  . ANTERIOR CRUCIATE LIGAMENT REPAIR     left  . KNEE ARTHROSCOPY WITH MENISCAL REPAIR Left 03/05/2016   Procedure: LEFT KNEE ARTHROSCOPY WITH  MENISCAL REPAIR;  Surgeon: Cammy Copa, MD;  Location: [facility] OR;  Service: Orthopedics;  Laterality: Left;   Social History   Occupational History  . Not on file.   Social History Main Topics  . Smoking status: Never Smoker  . Smokeless tobacco: Never Used  . Alcohol use No  . Drug use: No  . Sexual activity: Not on file

## 2016-09-16 ENCOUNTER — Ambulatory Visit (INDEPENDENT_AMBULATORY_CARE_PROVIDER_SITE_OTHER): Payer: Self-pay | Admitting: Orthopedic Surgery

## 2016-09-16 DIAGNOSIS — S83242D Other tear of medial meniscus, current injury, left knee, subsequent encounter: Secondary | ICD-10-CM

## 2016-09-16 NOTE — Progress Notes (Signed)
   Post-Op Visit Note   Patient: Annette Black           Date of Birth: [birth year]           MRN: [id number] Visit Date: 09/16/2016 PCP: Ronnald NianLalonde, John C, MD   Assessment & Plan:  Chief Complaint: No chief complaint on file.  Visit Diagnoses: No diagnosis found.  Plan: Annette Black is a 21 year old is now about 7 months out from medial meniscal repair.  This is done on the left knee.  Left knee has had previous anterior cruciate ligament reconstruction.  On exam she had about a centimeter and half of muscular atrophy in the thigh left versus right.  She has no effusion and excellent range of motion and a stable knee.  She has been working very hard over the summer in order to achieve improving strength.  I think she has reached that goal.  I would like for mildly to guide her back to return to play by September.  No current restrictions on activity that she does need a measured return to light scrimmage and contact.  Follow-Up Instructions: Return if symptoms worsen or fail to improve.   Orders:  No orders of the defined types were placed in this encounter.  No orders of the defined types were placed in this encounter.   Imaging: No results found.  PMFS History: Patient Active Problem List   Diagnosis Date Noted  . Acute medial meniscus tear of left knee    Past Medical History:  Diagnosis Date  . ACL (anterior cruciate ligament) tear   . Acute medial meniscal tear    left    No family history on file.  Past Surgical History:  Procedure Laterality Date  . ANTERIOR CRUCIATE LIGAMENT REPAIR     left  . KNEE ARTHROSCOPY WITH MENISCAL REPAIR Left 03/05/2016   Procedure: LEFT KNEE ARTHROSCOPY WITH MENISCAL REPAIR;  Surgeon: Cammy CopaScott Mavery Milling, MD;  Location: [facility] OR;  Service: Orthopedics;  Laterality: Left;   Social History   Occupational History  . Not on file.   Social History Main Topics  . Smoking status: Never Smoker  . Smokeless tobacco: Never Used  . Alcohol use No    . Drug use: No  . Sexual activity: Not on file

## 2018-02-21 IMAGING — MR MR KNEE*L* W/O CM
4 of 6 series · 21 of 40 positions shown · non-contrast
Comparison: None.

CLINICAL DATA: Pain after hyperextension injury playing soccer.

EXAM:
MRI OF THE LEFT KNEE WITHOUT CONTRAST
TECHNIQUE: Multiplanar, multisequence MR imaging of the knee was performed. No
intravenous contrast was administered.

[Series 3: pd_tse_fs_tra · axial · 4.0mm · 0.42mm/px · z∈[+11,+94]mm · 3 of 24 slices shown]
[im 5/24]
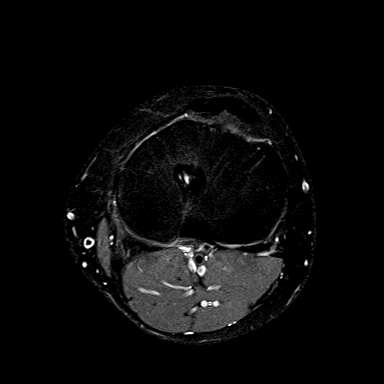
[im 14/24]
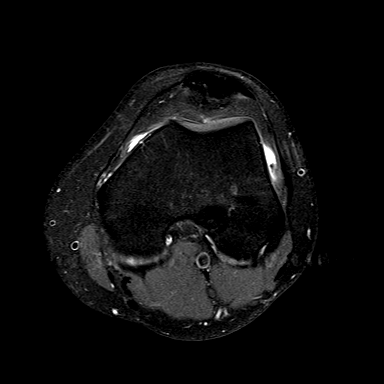
[im 24/24]
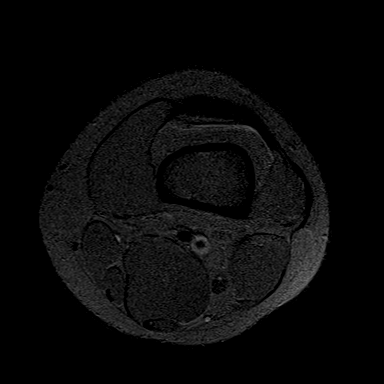

[Series 5: T2 fat-sat · coronal · 3.2mm · 0.62mm/px · 8 of 26 slices shown]
[im 1/26]
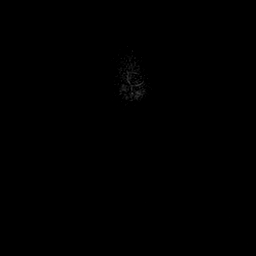
[im 4/26]
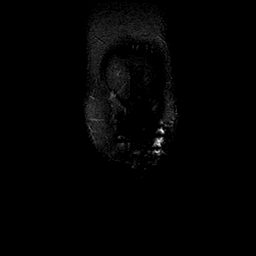
[im 8/26]
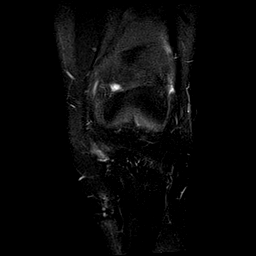
[im 11/26]
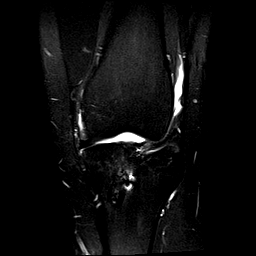
[im 15/26]
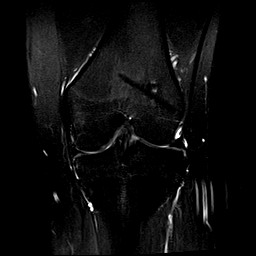
[im 18/26]
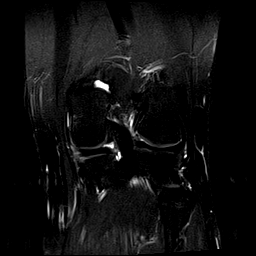
[im 22/26]
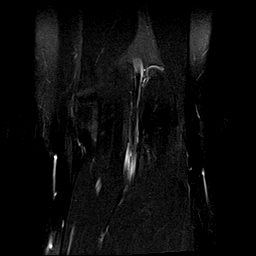
[im 26/26]
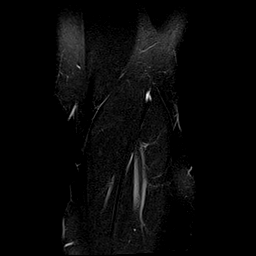

[Series 6: T1 · coronal · 3.2mm · 0.25mm/px · 3 of 26 slices shown]
[im 4/26]
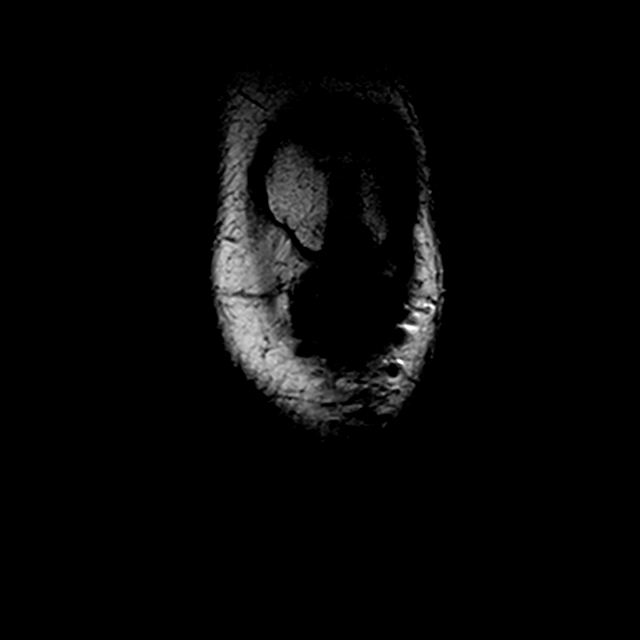
[im 15/26]
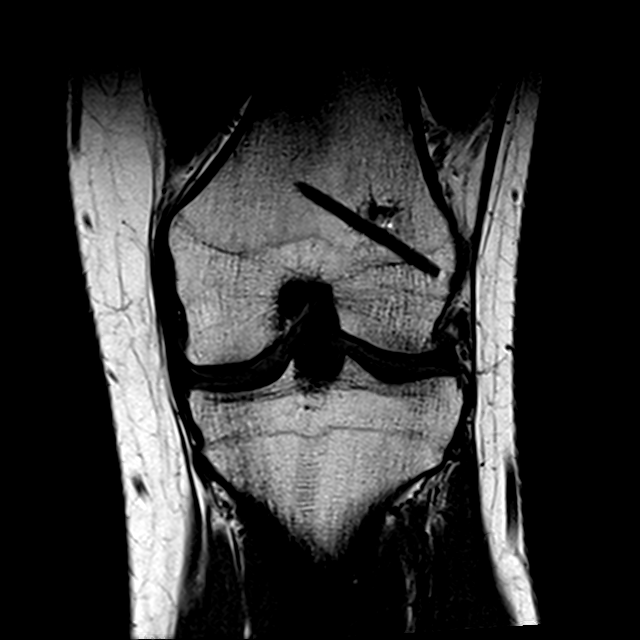
[im 22/26]
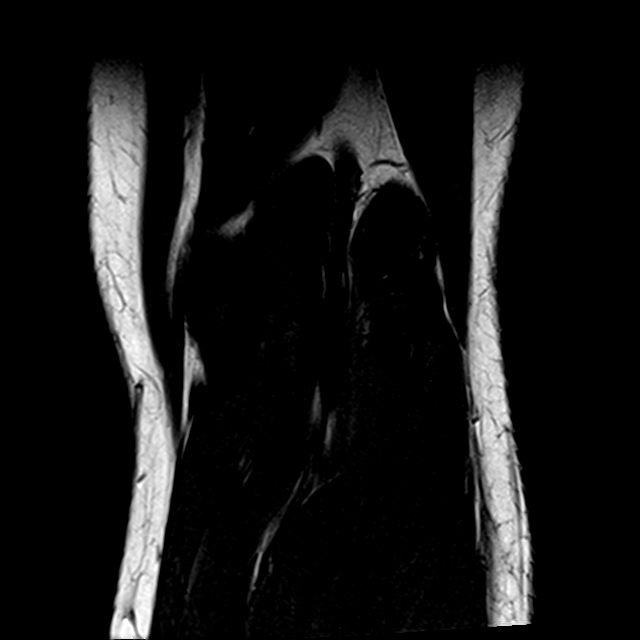

[Series 7: PD fat-sat · sagittal · 3.5mm · 0.25mm/px · 7 of 23 slices shown]
[im 1/23]
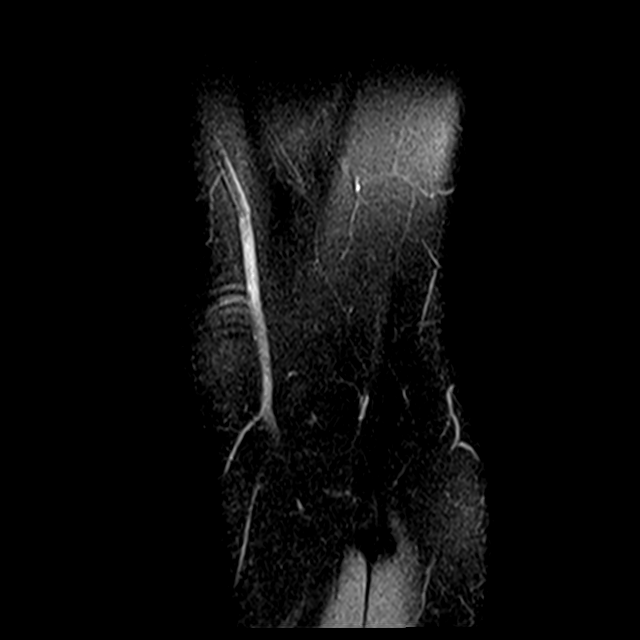
[im 4/23]
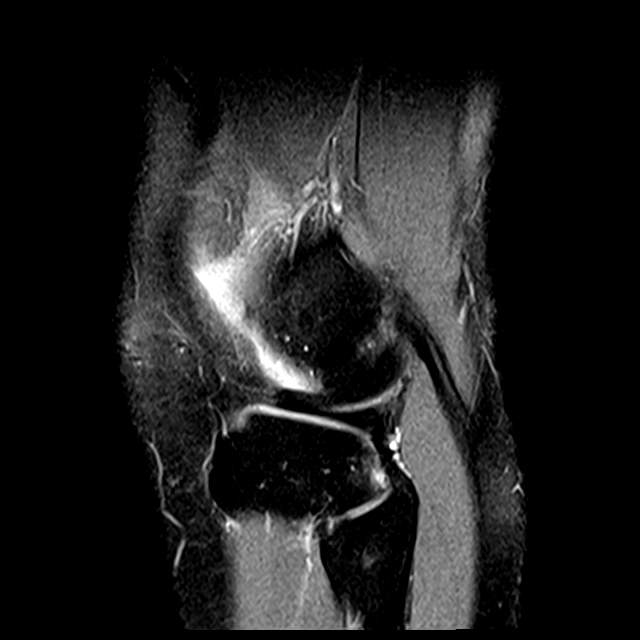
[im 8/23]
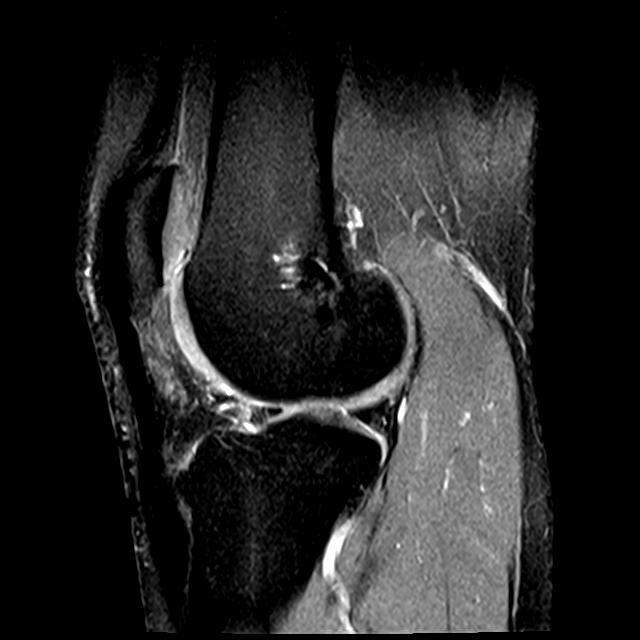
[im 12/23]
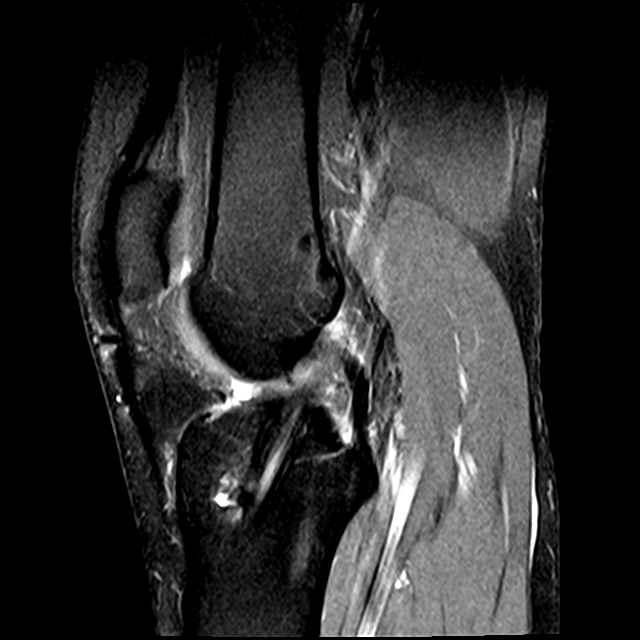
[im 15/23]
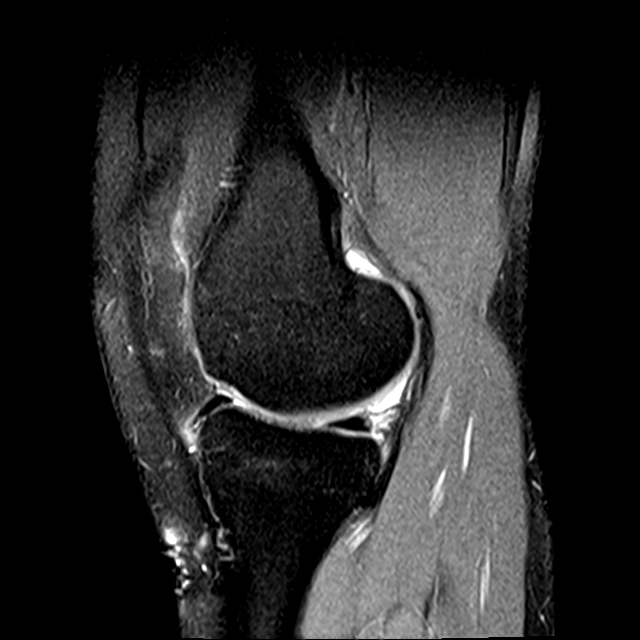
[im 19/23]
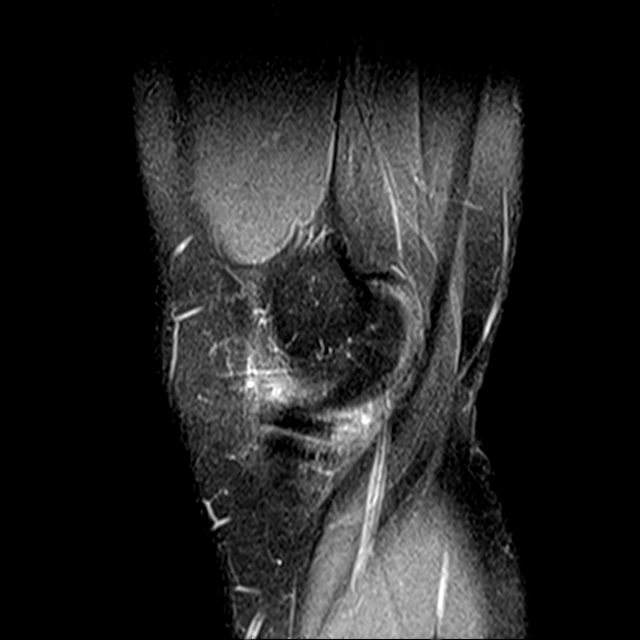
[im 23/23]
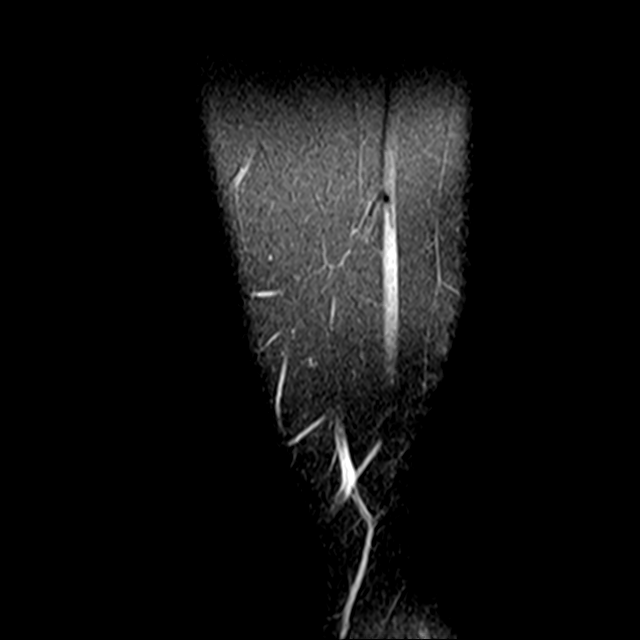

[21 of 40 positions shown; findings below may reference images not displayed]

FINDINGS: MENISCI

Medial meniscus: Meniscal capsular injury at the posterior horn-
body junction of the medial meniscus.

Lateral meniscus:  Intact.

LIGAMENTS

Cruciates: Prior ACL repair with the ACL graft intact. Mild
increased signal within the ACL graft likely reflecting mild
mucinous degeneration. Intact PCL. Tibial tunnel is in satisfactory
position.

Collaterals: Medial collateral ligament is intact. Lateral
collateral ligament complex is intact.

CARTILAGE

Patellofemoral:  No chondral defect.

Medial:  No chondral defect.

Lateral: Small focal area of partial-thickness cartilage loss
involving the lateral femoral condyle measuring 3 x 4 mm.

Joint: No joint effusion. Mild edema and Hoffa's fat. No plical
thickening.

Popliteal Fossa:  No Baker cyst.  Intact popliteus tendon.

Extensor Mechanism: Intact quadriceps tendon. Postsurgical changes
from prior patellar tendon harvesting. Remainder the patellar tendon
is intact.

Bones: No focal marrow signal abnormality. No fracture or
dislocation.

Other: No fluid collection or hematoma.
IMPRESSION: 1. Meniscal capsular injury at the posterior horn-body junction of
the medial meniscus.
2. Prior ACL repair with the ACL graft intact. Mild increased signal
within the ACL graft likely reflecting mild mucinous degeneration.
Tibial tunnel is in satisfactory position.
3. Small focal area of partial-thickness cartilage loss involving
the lateral femoral condyle measuring 3 x 4 mm.
